# Patient Record
Sex: Female | Born: 1990 | Race: White | Marital: Single | Smoking: Never smoker
Health system: Southern US, Community
[De-identification: ages and names within clinical notes are randomized; demographics above are authoritative.]

---

## 2015-02-07 ENCOUNTER — Emergency Department
Admission: EM | Admit: 2015-02-07 | Discharge: 2015-02-07 | Disposition: A | Discharge status: Home / Self Care | Payer: Managed Care, Other (non HMO) | Attending: Emergency Medicine | Admitting: Emergency Medicine

## 2015-02-07 ENCOUNTER — Emergency Department: Payer: Managed Care, Other (non HMO)

## 2015-02-07 DIAGNOSIS — R109 Unspecified abdominal pain: Secondary | ICD-10-CM

## 2015-02-07 DIAGNOSIS — R112 Nausea with vomiting, unspecified: Secondary | ICD-10-CM | POA: Insufficient documentation

## 2015-02-07 DIAGNOSIS — Z3202 Encounter for pregnancy test, result negative: Secondary | ICD-10-CM | POA: Diagnosis not present

## 2015-02-07 LAB — URINALYSIS COMPLETE WITH MICROSCOPIC (ARMC ONLY)
BILIRUBIN URINE: NEGATIVE
Bacteria, UA: NONE SEEN
GLUCOSE, UA: NEGATIVE mg/dL
HGB URINE DIPSTICK: NEGATIVE
Ketones, ur: NEGATIVE mg/dL
LEUKOCYTES UA: NEGATIVE
NITRITE: NEGATIVE
Protein, ur: NEGATIVE mg/dL
RBC / HPF: NONE SEEN RBC/hpf (ref 0–5)
SPECIFIC GRAVITY, URINE: 1.008 (ref 1.005–1.030)
pH: 6 (ref 5.0–8.0)

## 2015-02-07 LAB — BASIC METABOLIC PANEL
Anion gap: 8 (ref 5–15)
BUN: 12 mg/dL (ref 6–20)
CO2: 27 mmol/L (ref 22–32)
CREATININE: 0.84 mg/dL (ref 0.44–1.00)
Calcium: 9.3 mg/dL (ref 8.9–10.3)
Chloride: 105 mmol/L (ref 101–111)
Glucose, Bld: 93 mg/dL (ref 65–99)
POTASSIUM: 4.4 mmol/L (ref 3.5–5.1)
SODIUM: 140 mmol/L (ref 135–145)

## 2015-02-07 LAB — CBC
HEMATOCRIT: 39.2 % (ref 35.0–47.0)
Hemoglobin: 12.7 g/dL (ref 12.0–16.0)
MCH: 27.3 pg (ref 26.0–34.0)
MCHC: 32.3 g/dL (ref 32.0–36.0)
MCV: 84.6 fL (ref 80.0–100.0)
PLATELETS: 259 10*3/uL (ref 150–440)
RBC: 4.63 MIL/uL (ref 3.80–5.20)
RDW: 15.1 % — AB (ref 11.5–14.5)
WBC: 6.2 10*3/uL (ref 3.6–11.0)

## 2015-02-07 LAB — POCT PREGNANCY, URINE: Preg Test, Ur: NEGATIVE

## 2015-02-07 MED ORDER — NAPROXEN 500 MG PO TABS: 500.0000 mg | ORAL_TABLET | Freq: Two times a day (BID) | ORAL | Status: AC

## 2015-02-07 MED ORDER — ONDANSETRON 8 MG PO TBDP
8.0000 mg | ORAL_TABLET | Freq: Once | ORAL | Status: AC
  Administered 2015-02-07: 8 mg via ORAL
  Filled 2015-02-07: qty 1

## 2015-02-07 MED ORDER — CYCLOBENZAPRINE HCL 10 MG PO TABS: 10.0000 mg | ORAL_TABLET | Freq: Three times a day (TID) | ORAL | Status: AC | PRN

## 2015-02-07 MED ORDER — ONDANSETRON 8 MG PO TBDP: 8.0000 mg | ORAL_TABLET | Freq: Three times a day (TID) | ORAL | Status: AC | PRN

## 2015-02-07 NOTE — Discharge Instructions (Signed)
Advised to follow up with Family Doctor if condition does not improved in 3 days.

## 2015-02-07 NOTE — ED Provider Notes (Signed)
[facility] Emergency Department Provider Note  ____________________________________________  Time seen: Approximately 1:50 PM  I have reviewed the triage vital signs and the nursing notes.   HISTORY  Chief Complaint Flank Pain    HPI Allison Miranda is a 24 y.o. female patient complaining of left flank pain 4 days.Patient states she was diagnosed with a UTI 2 days ago and started on antibodies. Patient state her pain continue and she had an x-ray of the abdomen which did not show a kidney stone. Patient states she started developing nausea and vomiting today with increase of the flank pain. Patient stated the pain wax and wane and right now she is not having any pain.   History reviewed. No pertinent past medical history.  There are no active problems to display for this patient.   History reviewed. No pertinent past surgical history.  Current Outpatient Rx  Name  Route  Sig  Dispense  Refill  . naproxen (NAPROSYN) 500 MG tablet   Oral   Take 1 tablet (500 mg total) by mouth 2 (two) times daily with a meal.   20 tablet   00   . ondansetron (ZOFRAN ODT) 8 MG disintegrating tablet   Oral   Take 1 tablet (8 mg total) by mouth every 8 (eight) hours as needed for nausea or vomiting.   20 tablet   0     Allergies Review of patient's allergies indicates not on file.  No family history on file.  Social History Social History  Substance Use Topics  . Smoking status: Never Smoker   . Smokeless tobacco: Never Used  . Alcohol Use: Yes    Review of Systems Constitutional: No fever/chills Eyes: No visual changes. ENT: No sore throat. Cardiovascular: Denies chest pain. Respiratory: Denies shortness of breath. Gastrointestinal: No abdominal pain.  Nausea and vomiting  No diarrhea.  No constipation. Genitourinary: Negative for dysuria. Musculoskeletal: Positive for left flank pain  Skin: Negative for rash. Neurological: Negative for headaches,  focal weakness or numbness. 10-point ROS otherwise negative.  ____________________________________________   PHYSICAL EXAM:  VITAL SIGNS: ED Triage Vitals  Enc Vitals Group     BP 02/07/15 1314 159/92 mmHg     Pulse Rate 02/07/15 1314 74     Resp 02/07/15 1314 16     Temp 02/07/15 1314 98.6 F (37 C)     Temp Source 02/07/15 1314 Oral     SpO2 02/07/15 1314 100 %     Weight 02/07/15 1314 180 lb (81.647 kg)     Height 02/07/15 1314 5\' 3"  (1.6 m)     Head Cir --      Peak Flow --      Pain Score 02/07/15 1315 0     Pain Loc --      Pain Edu? --      Excl. in GC? --     Constitutional: Alert and oriented. Well appearing and in no acute distress. Eyes: Conjunctivae are normal. PERRL. EOMI. Head: Atraumatic. Nose: No congestion/rhinnorhea. Mouth/Throat: Mucous membranes are moist.  Oropharynx non-erythematous. Neck: No stridor. No cervical spine tenderness to palpation. Hematological/Lymphatic/Immunilogical: No cervical lymphadenopathy. Cardiovascular: Normal rate, regular rhythm. Grossly normal heart sounds.  Good peripheral circulation. Respiratory: Normal respiratory effort.  No retractions. Lungs CTAB. Gastrointestinal: Soft and nontender. No distention. No abdominal bruits. Left flank guarding Genitourinary: Deferred Musculoskeletal: No lower extremity tenderness nor edema.  No joint effusions. Neurologic:  Normal speech and language. No gross focal neurologic deficits are appreciated.  No gait instability. Skin:  Skin is warm, dry and intact. No rash noted. Psychiatric: Mood and affect are normal. Speech and behavior are normal.  ____________________________________________   LABS (all labs ordered are listed, but only abnormal results are displayed)  Labs Reviewed  CBC - Abnormal; Notable for the following:    RDW 15.1 (*)    All other components within normal limits  URINALYSIS COMPLETEWITH MICROSCOPIC ([facility] ONLY) - Abnormal; Notable for the following:     Color, Urine STRAW (*)    APPearance CLEAR (*)    Squamous Epithelial / LPF 0-5 (*)    All other components within normal limits  BASIC METABOLIC PANEL  POC URINE PREG, ED  POCT PREGNANCY, URINE   ____________________________________________  EKG   ____________________________________________  RADIOLOGY  CT scan unremarkable except for small cysts. No signs of kidney stones. ____________________________________________   PROCEDURES  Procedure(s) performed: None  Critical Care performed: No  ____________________________________________   INITIAL IMPRESSION / ASSESSMENT AND PLAN / ED COURSE  Pertinent labs & imaging results that were available during my care of the patient were reviewed by me and considered in my medical decision making (see chart for details).  Left flank pain. Resolving UTI. Advised patient to continue antibodies even though the lab tests do not correlate with her diagnoses. Advised patient to follow-up family doctor if condition persists. Advised return by ER condition worsens. ____________________________________________   FINAL CLINICAL IMPRESSION(S) / ED DIAGNOSES  Final diagnoses:  Left flank pain  Nausea and vomiting in adult      Joni Reining, PA-C 02/07/15 1507  Joni Reining, PA-C 02/07/15 1511  Minna Antis, MD 02/07/15 [phone]

## 2015-02-07 NOTE — ED Notes (Addendum)
Pt c/o left flank pain since Tuesday and dx with UTI possible kidney stones and has not been able to keep water down.the patient is in NAD on arrival..denies any pain at present.

## 2017-02-27 IMAGING — CT CT RENAL STONE PROTOCOL
1 of 2 series · 5 of 32 positions shown, 10 images · non-contrast
Comparison: None.

CLINICAL DATA: Left flank pain for 1 week

EXAM:
CT ABDOMEN AND PELVIS WITHOUT CONTRAST
TECHNIQUE: Multidetector CT imaging of the abdomen and pelvis was performed
following the standard protocol without IV contrast.

[Series 4: lung windows · axial · 0.66mm/px · z∈[-25,+60]mm · 5 of 27 slices shown, 10 images]
[im 5/27  soft-tissue]
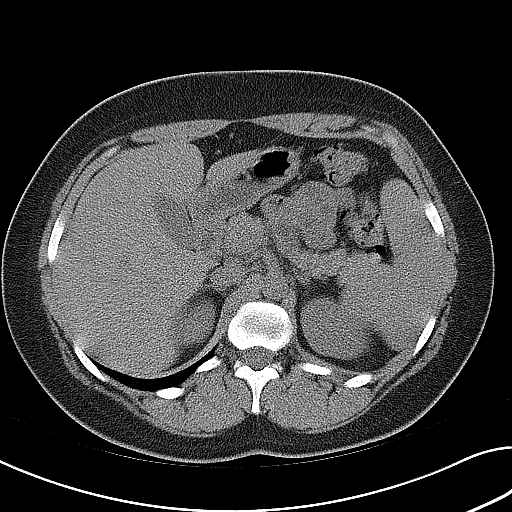
[im 5/27  bone]
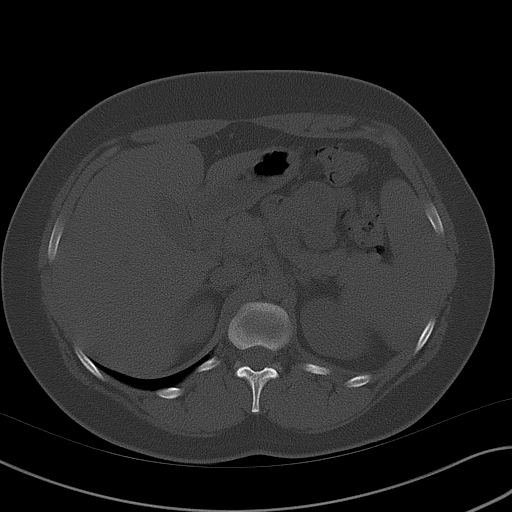
[im 9/27  soft-tissue]
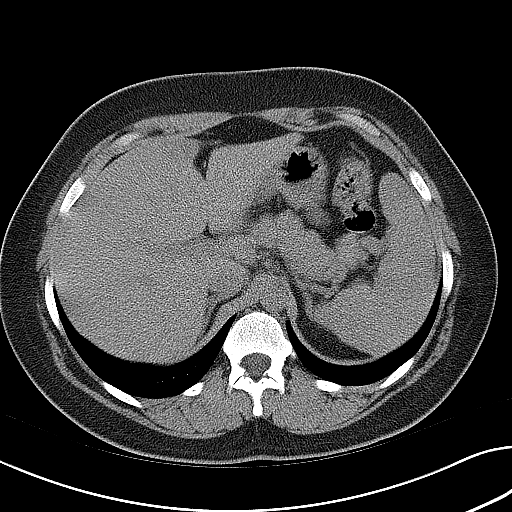
[im 9/27  lung]
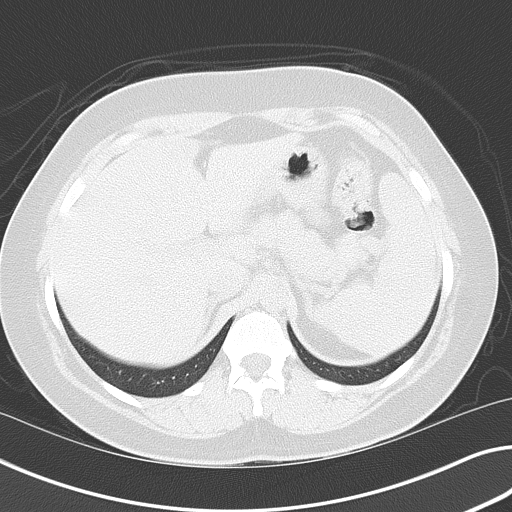
[im 14/27  soft-tissue]
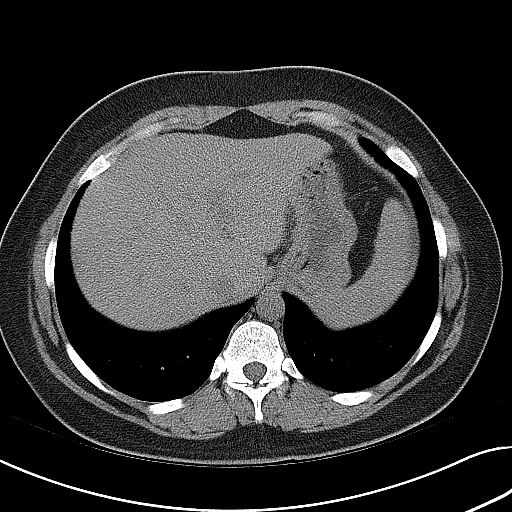
[im 14/27  lung]
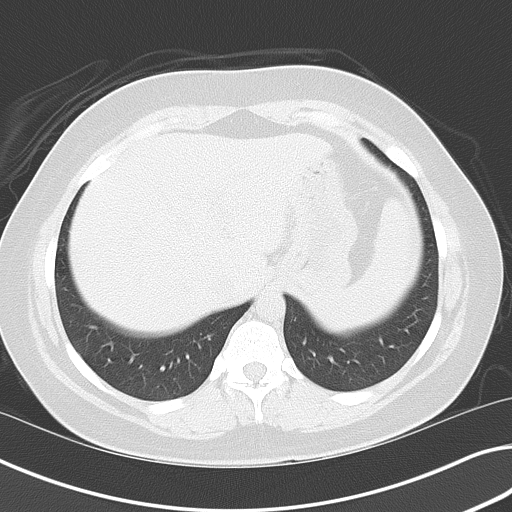
[im 18/27  soft-tissue]
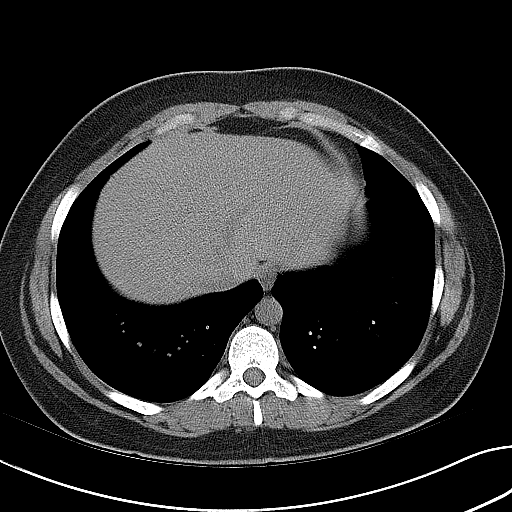
[im 18/27  lung]
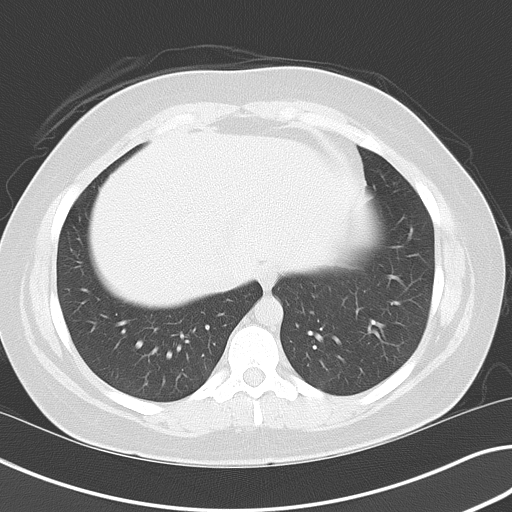
[im 22/27  soft-tissue]
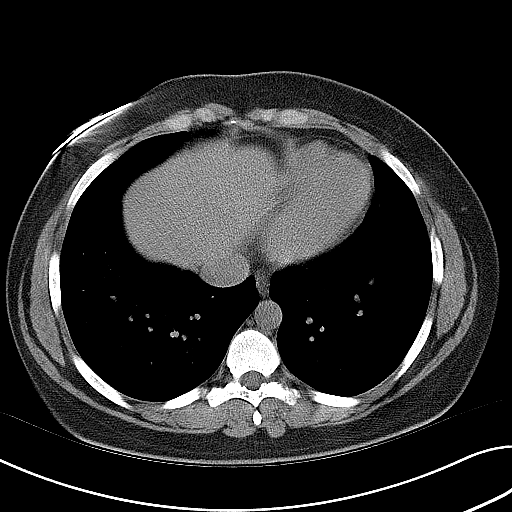
[im 22/27  lung]
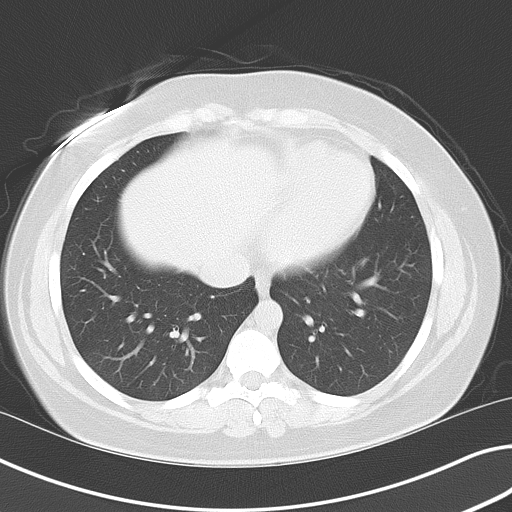

[5 of 32 positions shown; findings below may reference images not displayed]

FINDINGS: No hydronephrosis. No urinary calculus. Small simple cyst in the
upper pole of the left kidney.

Liver, gallbladder, spleen, pancreas, and adrenal glands are within
normal limits

Normal appendix.

Bladder, uterus, and adnexa are within normal limits.

No vertebral compression deformity.
IMPRESSION: No evidence of urinary calculus or urinary obstruction.

## 2018-07-11 ENCOUNTER — Telehealth: Payer: Self-pay | Admitting: Neurology

## 2018-09-12 NOTE — Telephone Encounter (Signed)
Opened encounter in error  

## 2018-11-19 DEATH — deceased
